# Patient Record
Sex: Male | Born: 1965 | Race: Black or African American | Hispanic: No | Marital: Married | Smoking: Former smoker
Health system: Southern US, Community
[De-identification: ages and names within clinical notes are randomized; demographics above are authoritative.]

## PROBLEM LIST (undated history)

## (undated) DIAGNOSIS — M069 Rheumatoid arthritis, unspecified: Secondary | ICD-10-CM

## (undated) DIAGNOSIS — I10 Essential (primary) hypertension: Secondary | ICD-10-CM

## (undated) DIAGNOSIS — M1990 Unspecified osteoarthritis, unspecified site: Secondary | ICD-10-CM

## (undated) DIAGNOSIS — Z8669 Personal history of other diseases of the nervous system and sense organs: Secondary | ICD-10-CM

## (undated) DIAGNOSIS — C61 Malignant neoplasm of prostate: Secondary | ICD-10-CM

## (undated) DIAGNOSIS — J45909 Unspecified asthma, uncomplicated: Secondary | ICD-10-CM

## (undated) HISTORY — DX: Essential (primary) hypertension: I10

## (undated) HISTORY — DX: Personal history of other diseases of the nervous system and sense organs: Z86.69

## (undated) HISTORY — DX: Rheumatoid arthritis, unspecified: M06.9

## (undated) HISTORY — PX: PROSTATE SURGERY: SHX751

## (undated) HISTORY — DX: Unspecified osteoarthritis, unspecified site: M19.90

## (undated) HISTORY — DX: Malignant neoplasm of prostate: C61

---

## 2009-04-23 ENCOUNTER — Ambulatory Visit (HOSPITAL_BASED_OUTPATIENT_CLINIC_OR_DEPARTMENT_OTHER): Admission: RE | Admit: 2009-04-23 | Discharge: 2009-04-23 | Payer: Self-pay | Admitting: Otolaryngology

## 2009-04-27 ENCOUNTER — Ambulatory Visit: Payer: Self-pay | Admitting: Internal Medicine

## 2011-03-05 DIAGNOSIS — C61 Malignant neoplasm of prostate: Secondary | ICD-10-CM | POA: Insufficient documentation

## 2011-03-05 HISTORY — DX: Malignant neoplasm of prostate: C61

## 2011-03-25 ENCOUNTER — Other Ambulatory Visit: Payer: Self-pay | Admitting: Urology

## 2011-03-26 ENCOUNTER — Other Ambulatory Visit: Payer: Self-pay | Admitting: Urology

## 2011-04-14 ENCOUNTER — Encounter: Payer: Self-pay | Admitting: *Deleted

## 2011-04-14 DIAGNOSIS — M1990 Unspecified osteoarthritis, unspecified site: Secondary | ICD-10-CM | POA: Insufficient documentation

## 2011-04-14 DIAGNOSIS — I10 Essential (primary) hypertension: Secondary | ICD-10-CM | POA: Insufficient documentation

## 2011-04-14 DIAGNOSIS — Z8669 Personal history of other diseases of the nervous system and sense organs: Secondary | ICD-10-CM | POA: Insufficient documentation

## 2011-04-14 NOTE — Progress Notes (Signed)
Path:03/05/11=Prostate Bxs=Adenocarcinoma,Gleason=3+3=6, Volume=20cc, PSA=3.23 PSA 07/2010=3.44 PSA 10/2010=3.23 PSA 02/27/08=2.48  Married, 2 children, Therapist, sports ,  Mother deceased pneumonia age 47,paternal hx Prostate Ca     ALlergies:NKDA   05/13/11 Scheduled for Radical Robot Assisted  Laparoscopic prostatectomy with Dr. Natalia Leatherwood

## 2011-04-15 ENCOUNTER — Encounter: Payer: Self-pay | Admitting: Radiation Oncology

## 2011-04-15 ENCOUNTER — Ambulatory Visit
Admission: RE | Admit: 2011-04-15 | Discharge: 2011-04-15 | Disposition: A | Discharge status: Home / Self Care | Payer: BC Managed Care – PPO | Source: Ambulatory Visit | Attending: Radiation Oncology | Admitting: Radiation Oncology

## 2011-04-15 ENCOUNTER — Ambulatory Visit: Payer: Self-pay

## 2011-04-15 VITALS — BP 130/78 | HR 63 | Temp 98.4°F | Resp 20 | Ht 72.0 in | Wt 213.8 lb

## 2011-04-15 DIAGNOSIS — C61 Malignant neoplasm of prostate: Secondary | ICD-10-CM | POA: Insufficient documentation

## 2011-04-15 NOTE — Progress Notes (Signed)
Please see the Nurse Progress Note in the MD Initial Consult Encounter for this patient. 

## 2011-04-15 NOTE — Progress Notes (Signed)
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 male who hasn't diligently pursued PSA screening because of the increased risk for prostate cancer based on family history and raise. His PSA level on 02/27/2008 was 2.48. This showed a gradual increase to a level of 3.44 in April of 2012. He short interval follow up PSA on 10/21/2002 was 3.23. The patient pursued evaluation with urology and was found to have no nodules with a 40 g prostate on digital rectal exam. He has a rising trend of his PSA and family history, Dr. Tawanna Ward elected to proceed with transrectal ultrasound and prostate biopsies on November 29 of 2012. Prostate volume was measured to be 20 cc. 12 core biopsy samples were obtained and 3/12 were positive for adenocarcinoma with a Gleason score 3+3 and less than 5% left lateral base, 40% left lateral apex, and 40% left apex. The patient review the biopsy results with Dr. Margarita Ward and has currently been referred to discuss potential treatment options  PREVIOUS RADIATION THERAPY: No  PAST MEDICAL HISTORY:  has a past medical history of History of sleep apnea; Hypertension; Arthritis; and Prostate cancer (03/05/11).    PAST SURGICAL HISTORY:History reviewed. No pertinent past surgical history.  FAMILY HISTORY: family history includes Cancer (age of onset:68) in his father and Pneumonia in his mother.  SOCIAL HISTORY:  reports that he quit smoking about 20 years ago. He does not have any smokeless tobacco history on file. He reports that he drinks alcohol. He reports that he does not use illicit drugs.  ALLERGIES:  Review of patient's allergies indicates no known allergies.  MEDICATIONS:  Current Outpatient Prescriptions  Medication Sig Dispense Refill  . Multiple Vitamin (MULTIVITAMIN) tablet Take 1 tablet by mouth daily.        . propranolol (INDERAL) 20 MG tablet Take 20 mg by mouth 2 (two) times daily.          REVIEW OF SYSTEMS:  A comprehensive review of systems was negative. a 15 point review of systems is documented in the medical record and I reviewed this with the patient today. Patient did a lot of S. questionnaire reporting score 0 suggesting mild to minimal outflow obstructive symptoms. He also fell out of a rectal function questionnaire indicating that he is able achieve erection to initiate intercourse but sometimes struggles to maintain erection through completion of intercourse.   PHYSICAL EXAM:  height is 6' (1.829 m) and weight is 213 lb 12.8 oz (96.979 kg). His oral temperature is 98.4 F (36.9 C). His blood pressure is 130/78 and his pulse is 63. His respiration is 20.  further detailed physical exam was deferred today. The patient was documented to have no nodules in the prostate per urology.    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 He falls into a favorable risk group for management of prostate cancer and is eligible for a variety of potential treatment options. His options include active surveillance, radical prostatectomy, external beam radiotherapy, and prostate radiotherapy.  PLAN: Today, I spent time talking with Victor Ward about his  findings and workup thus far. We talked about the natural history of prostate cancer and potential treatment options for prostate cancer. We discussed the implications of T. stage, Gleason score and PSA on treatment decisions will his treatment outcomes. We compared and contrasted radical prostatectomy against radiation therapy and discussed the different forms  of radiation therapy including brachytherapy and external radiation. We talked about the potential advantages of surgery for patients who are under the age of 16. We discussed the long-term followup with surgical series. We talked about the potential for full dose prostatic fossa radiotherapy for salvage in the event of biochemical failure following surgery. We talked about the potential advantage of surgical staging to assess the extent of tumor. We also talked about radiotherapy.  We spent more than 50% or 1 hour visit today patient counseling and coordination of care. Ultimately we spent time talking about watchful waiting and active surveillance. Patient understands that this is a option for him. Over time, in all likelihood a rising PSA or increasing involvement of the prostate gland by cancer would likely lead towards intervention given his young age. We talked about using watchful waiting for delayed intervention versus immediate treatment. At this point, I think the patient is leaning towards radical prostatectomy which is very appropriate, but he seems to have an interest in the lying treatment for some period of active surveillance.  I enjoyed meeting with the patient today and will look forward to follow along his progress. I provided with my business card and direct office phone number if he has additional questions or concerns as he progresses through decision-making and treatment.   ------------------------------------------------  Artist Pais. Kathrynn Running, M.D.

## 2011-04-17 ENCOUNTER — Encounter: Payer: Self-pay | Admitting: *Deleted

## 2011-05-13 ENCOUNTER — Inpatient Hospital Stay (HOSPITAL_COMMUNITY): Admission: RE | Admit: 2011-05-13 | Payer: BC Managed Care – PPO | Source: Ambulatory Visit | Admitting: Urology

## 2011-05-13 ENCOUNTER — Encounter (HOSPITAL_COMMUNITY): Admission: RE | Payer: Self-pay | Source: Ambulatory Visit

## 2011-05-13 SURGERY — ROBOTIC ASSISTED LAPAROSCOPIC RADICAL PROSTATECTOMY
Anesthesia: General

## 2011-05-29 ENCOUNTER — Encounter: Payer: Self-pay | Admitting: *Deleted

## 2011-05-29 NOTE — Progress Notes (Signed)
[facility] Psychosocial Distress Screening Clinical Social Work  Clinical Social Work was referred by distress screening protocol.  The patient scored a 8 on the Psychosocial Distress Thermometer which indicates moderate distress. Doctoral counseling intern Elige Radon McKibben attempted to contact patient to assess for distress and other psychosocial needs. McKibben made multiple attempts to contact patient and left message on home phone number.    Clinical Social Worker follow up needed: no  If yes, follow up plan:  Kathrin Penner, MSW, [facility] Clinical Social Worker [facility] [phone]

## 2011-08-25 NOTE — Progress Notes (Signed)
Encounter addended by: Lowella Petties, RN on: 08/25/2011 11:00 AM<BR>     Documentation filed: Charges VN

## 2013-04-17 ENCOUNTER — Emergency Department (HOSPITAL_COMMUNITY)
Admission: EM | Admit: 2013-04-17 | Discharge: 2013-04-17 | Disposition: A | Discharge status: Home / Self Care | Payer: Managed Care, Other (non HMO) | Attending: Emergency Medicine | Admitting: Emergency Medicine

## 2013-04-17 ENCOUNTER — Emergency Department (HOSPITAL_COMMUNITY): Payer: Managed Care, Other (non HMO)

## 2013-04-17 ENCOUNTER — Encounter (HOSPITAL_COMMUNITY): Payer: Self-pay | Admitting: Emergency Medicine

## 2013-04-17 DIAGNOSIS — J111 Influenza due to unidentified influenza virus with other respiratory manifestations: Secondary | ICD-10-CM | POA: Insufficient documentation

## 2013-04-17 DIAGNOSIS — IMO0001 Reserved for inherently not codable concepts without codable children: Secondary | ICD-10-CM | POA: Insufficient documentation

## 2013-04-17 DIAGNOSIS — M19029 Primary osteoarthritis, unspecified elbow: Secondary | ICD-10-CM | POA: Insufficient documentation

## 2013-04-17 DIAGNOSIS — J45901 Unspecified asthma with (acute) exacerbation: Secondary | ICD-10-CM | POA: Insufficient documentation

## 2013-04-17 DIAGNOSIS — I10 Essential (primary) hypertension: Secondary | ICD-10-CM | POA: Insufficient documentation

## 2013-04-17 DIAGNOSIS — R5381 Other malaise: Secondary | ICD-10-CM | POA: Insufficient documentation

## 2013-04-17 DIAGNOSIS — Z79899 Other long term (current) drug therapy: Secondary | ICD-10-CM | POA: Insufficient documentation

## 2013-04-17 DIAGNOSIS — Z8546 Personal history of malignant neoplasm of prostate: Secondary | ICD-10-CM | POA: Insufficient documentation

## 2013-04-17 DIAGNOSIS — Z8669 Personal history of other diseases of the nervous system and sense organs: Secondary | ICD-10-CM | POA: Insufficient documentation

## 2013-04-17 DIAGNOSIS — R5383 Other fatigue: Secondary | ICD-10-CM

## 2013-04-17 DIAGNOSIS — Z87891 Personal history of nicotine dependence: Secondary | ICD-10-CM | POA: Insufficient documentation

## 2013-04-17 DIAGNOSIS — R51 Headache: Secondary | ICD-10-CM | POA: Insufficient documentation

## 2013-04-17 HISTORY — DX: Unspecified asthma, uncomplicated: J45.909

## 2013-04-17 LAB — CBC
HEMATOCRIT: 39.9 % (ref 39.0–52.0)
Hemoglobin: 13.9 g/dL (ref 13.0–17.0)
MCH: 30.3 pg (ref 26.0–34.0)
MCHC: 34.8 g/dL (ref 30.0–36.0)
MCV: 86.9 fL (ref 78.0–100.0)
PLATELETS: 152 10*3/uL (ref 150–400)
RBC: 4.59 MIL/uL (ref 4.22–5.81)
RDW: 13.2 % (ref 11.5–15.5)
WBC: 16 10*3/uL — AB (ref 4.0–10.5)

## 2013-04-17 LAB — POCT I-STAT, CHEM 8
BUN: 13 mg/dL (ref 6–23)
CALCIUM ION: 1.21 mmol/L (ref 1.12–1.23)
Chloride: 101 mEq/L (ref 96–112)
Creatinine, Ser: 1.3 mg/dL (ref 0.50–1.35)
GLUCOSE: 173 mg/dL — AB (ref 70–99)
HCT: 45 % (ref 39.0–52.0)
Hemoglobin: 15.3 g/dL (ref 13.0–17.0)
Potassium: 4 mEq/L (ref 3.7–5.3)
Sodium: 140 mEq/L (ref 137–147)
TCO2: 26 mmol/L (ref 0–100)

## 2013-04-17 MED ORDER — ALBUTEROL SULFATE HFA 108 (90 BASE) MCG/ACT IN AERS
2.0000 | INHALATION_SPRAY | RESPIRATORY_TRACT | Status: DC | PRN
  Administered 2013-04-17: 2 via RESPIRATORY_TRACT
  Filled 2013-04-17: qty 6.7

## 2013-04-17 MED ORDER — SODIUM CHLORIDE 0.9 % IV BOLUS (SEPSIS)
1000.0000 mL | Freq: Once | INTRAVENOUS | Status: AC
  Administered 2013-04-17: 1000 mL via INTRAVENOUS

## 2013-04-17 MED ORDER — ACETAMINOPHEN 325 MG PO TABS: 650.0000 mg | ORAL_TABLET | Freq: Once | ORAL | Status: DC

## 2013-04-17 MED ORDER — ACETAMINOPHEN 325 MG PO TABS
650.0000 mg | ORAL_TABLET | Freq: Once | ORAL | Status: AC
  Administered 2013-04-17: 650 mg via ORAL
  Filled 2013-04-17: qty 2

## 2013-04-17 NOTE — Discharge Instructions (Signed)
The chest x-ray does not reveal any pneumonia.  At this time.  You have been given an inhaler to use as follows 2 puffs every 4-6 hours while awake for the next 2 days, then as needed. Please call your primary care physician to let them know of your ED visit.  Tonight.  I would advise not going to the office unless your specifically instructed to buy them. You have been given a work note, is recommended that he stay home on until you are  fever free for 24 hours

## 2013-04-17 NOTE — ED Provider Notes (Signed)
Medical screening examination/treatment/procedure(s) were performed by non-physician practitioner and as supervising physician I was immediately available for consultation/collaboration.    Kalman Drape, MD 04/17/13 [phone]

## 2013-04-17 NOTE — ED Notes (Signed)
Bed: XH37 Expected date: 04/17/13 Expected time: 4:13 AM Means of arrival: Ambulance Comments: Bed 5, EMS, 39 M, [place] Symptoms/Asthma

## 2013-04-17 NOTE — ED Notes (Signed)
Per EMS report: pt from home: pt c/o SOB, fever, fatigue, cough, HA.  Pt denies feeling a fever but EMS reports pt is hot to the touch.  This morning is day 3 of his symptoms. EMS reports lungs were clear.  EMS VS: BP: 154/90, HR 112, RR: 20, 98% 2LNC.

## 2013-04-17 NOTE — ED Provider Notes (Addendum)
CSN: [id number]     Arrival date & time 04/17/13  3762 History   First MD Initiated Contact with Patient 04/17/13 0435     Chief Complaint  Patient presents with  . Influenza   (Consider location/radiation/quality/duration/timing/severity/associated sxs/prior Treatment) HPI Comments: 3 days of fever myalgia, congestion cough developed SOB last night with a "strangled" feeling in his throat  Ems was called at about 11:30 but by the time.  They arrived, patient was feeling, better he again developed the same sensation of choking, and feeling short of breath, at 3:30 in the morning EMS and transported patient to the hospital, where he was febrile, tachycardic, but no longer having shortness of breath.  He does have an appointment with his primary care physician.  This morning.  He thought he could wait, but became frightened by the second episode of shortness of breath.  Patient states his appetite has been fine.  Has been drinking plenty of fluids.  No vomiting, or diarrhea  Patient is a 48 y.o. male presenting with flu symptoms. The history is provided by the patient.  Influenza Presenting symptoms: cough, fatigue, fever, headache, rhinorrhea and shortness of breath   Presenting symptoms: no diarrhea, no nausea, no sore throat and no vomiting   Cough:    Cough characteristics:  Non-productive   Severity:  Moderate   Onset quality:  Sudden   Duration:  3 days   Progression:  Worsening   Chronicity:  New Fatigue:    Severity:  Moderate   Duration:  3 days   Timing:  Constant Fever:    Duration:  3 days   Timing:  Intermittent   Temp source:  Subjective Headaches:    Severity:  Moderate   Onset quality:  Gradual   Timing:  Intermittent   Chronicity:  New Severity:  Mild Onset quality:  Sudden Duration:  1 day Progression:  Waxing and waning Chronicity:  New Ineffective treatments:  None tried Associated symptoms: decreased physical activity and nasal congestion   Risk factors: no  immunocompromised state and no sick contacts   Risk factors comment:  Hypertension   Past Medical History  Diagnosis Date  . History of sleep apnea   . Hypertension   . Arthritis     R elbow, dx w/RA age 64, 2 flareups  . Prostate cancer 03/05/11    gleason 3+3=6, volume 20 cc  . Asthma    Past Surgical History  Procedure Laterality Date  . Prostate surgery      Prostate removal   Family History  Problem Relation Age of Onset  . Pneumonia Mother   . Cancer Father 46    prostate   History  Substance Use Topics  . Smoking status: Former Smoker -- 11 years    Quit date: 04/07/1991  . Smokeless tobacco: Not on file     Comment: 1 pk per week  . Alcohol Use: No     Comment: few per month    Review of Systems  Constitutional: Positive for fever and fatigue.  HENT: Positive for congestion and rhinorrhea. Negative for sore throat.   Respiratory: Positive for cough and shortness of breath.   Gastrointestinal: Negative for nausea, vomiting and diarrhea.  Neurological: Positive for headaches.    Allergies  Review of patient's allergies indicates no known allergies.  Home Medications   Current Outpatient Rx  Name  Route  Sig  Dispense  Refill  . acetaminophen (TYLENOL) 500 MG tablet   Oral   Take  1,000 mg by mouth every 6 (six) hours as needed for moderate pain or headache.         Marland Kitchen amLODipine (NORVASC) 2.5 MG tablet   Oral   Take 2.5 mg by mouth every morning.         . diphenhydrAMINE (BENADRYL) 25 MG tablet   Oral   Take 25 mg by mouth every 6 (six) hours as needed for allergies.         Marland Kitchen guaiFENesin (MUCINEX) 600 MG 12 hr tablet   Oral   Take 600 mg by mouth 2 (two) times daily as needed for cough or to loosen phlegm.         . Multiple Vitamin (MULTIVITAMIN WITH MINERALS) TABS tablet   Oral   Take 1 tablet by mouth every morning.         Marland Kitchen PRESCRIPTION MEDICATION   Injection   Inject 2 mLs as directed 3 (three) times a week.  Papaverine-phentolamine 30mg -1mg /ml custom mixed medication injected three times weekly as needed for erectile dysfunction          BP 141/81  Pulse 97  Temp(Src) 99.2 F (37.3 C) (Oral)  Resp 20  SpO2 98% Physical Exam  Nursing note and vitals reviewed. Constitutional: He is oriented to person, place, and time. He appears well-developed and well-nourished.  HENT:  Head: Normocephalic.  Neck: Normal range of motion.  Cardiovascular: Regular rhythm.  Tachycardia present.   Pulmonary/Chest: Effort normal and breath sounds normal.  Abdominal: Soft.  Lymphadenopathy:    He has no cervical adenopathy.  Neurological: He is alert and oriented to person, place, and time.  Skin: Skin is warm. No rash noted.    ED Course  Procedures (including critical care time) Labs Review Labs Reviewed  CBC - Abnormal; Notable for the following:    WBC 16.0 (*)    All other components within normal limits  POCT I-STAT, CHEM 8 - Abnormal; Notable for the following:    Glucose, Bld 173 (*)    All other components within normal limits   Imaging Review Dg Chest 2 View  04/17/2013   CLINICAL DATA:  Shortness of breath and PE  EXAM: CHEST  2 VIEW  COMPARISON:  None.  FINDINGS: Normal heart size and mediastinal contours. No acute infiltrate or edema. No effusion or pneumothorax. No acute osseous findings.  IMPRESSION: No active cardiopulmonary disease.   Electronically Signed   By: Jorje Guild M.D.   On: 04/17/2013 05:33    EKG Interpretation   None       MDM   1. Influenza     Is to be given, 1 L fluid, 650 of Tylenol.  Patient is feeling much better.  His vital signs have stabilized.  He is been given an albuterol inhaler to use at home 2 puffs every 4-6 hours while awake for 2 days and then as needed.  He is to call his primary care physician, but not actually go to the office unless he is specifically instructed to by his PCP    Garald Balding, NP 04/17/13 [place],  NP 04/28/13 2046

## 2013-04-24 NOTE — ED Provider Notes (Deleted)
CSN: [id number]     Arrival date & time 04/17/13  4166 History   First MD Initiated Contact with Patient 04/17/13 0435     Chief Complaint  Patient presents with  . Influenza   (Consider location/radiation/quality/duration/timing/severity/associated sxs/prior Treatment) HPI  Past Medical History  Diagnosis Date  . History of sleep apnea   . Hypertension   . 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 Prostate cancer 03/05/11    gleason 3+3=6, volume 20 cc  . Asthma    Past Surgical History  Procedure Laterality Date  . Prostate surgery      Prostate removal   Family History  Problem Relation Age of Onset  . Pneumonia Mother   . Cancer Father 22    prostate   History  Substance Use Topics  . Smoking status: Former Smoker -- 11 years    Quit date: 04/07/1991  . Smokeless tobacco: Not on file     Comment: 1 pk per week  . Alcohol Use: No     Comment: few per month    Review of Systems  Allergies  Review of patient's allergies indicates no known allergies.  Home Medications   Current Outpatient Rx  Name  Route  Sig  Dispense  Refill  . acetaminophen (TYLENOL) 500 MG tablet   Oral   Take 1,000 mg by mouth every 6 (six) hours as needed for moderate pain or headache.         Marland Kitchen amLODipine (NORVASC) 2.5 MG tablet   Oral   Take 2.5 mg by mouth every morning.         . diphenhydrAMINE (BENADRYL) 25 MG tablet   Oral   Take 25 mg by mouth every 6 (six) hours as needed for allergies.         Marland Kitchen guaiFENesin (MUCINEX) 600 MG 12 hr tablet   Oral   Take 600 mg by mouth 2 (two) times daily as needed for cough or to loosen phlegm.         . Multiple Vitamin (MULTIVITAMIN WITH MINERALS) TABS tablet   Oral   Take 1 tablet by mouth every morning.         Marland Kitchen PRESCRIPTION MEDICATION   Injection   Inject 2 mLs as directed 3 (three) times a week. Papaverine-phentolamine 30mg -1mg /ml custom mixed medication injected three times weekly as needed for erectile  dysfunction          BP 133/68  Pulse 93  Temp(Src) 98.6 F (37 C) (Oral)  Resp 18  SpO2 94% Physical Exam  ED Course  Procedures (including critical care time) Labs Review Labs Reviewed  CBC - Abnormal; Notable for the following:    WBC 16.0 (*)    All other components within normal limits  POCT I-STAT, CHEM 8 - Abnormal; Notable for the following:    Glucose, Bld 173 (*)    All other components within normal limits   Imaging Review No results found.  EKG Interpretation   None       MDM   1. Influenza        Garald Balding, NP 04/24/13 2239

## 2013-04-29 NOTE — ED Provider Notes (Signed)
Medical screening examination/treatment/procedure(s) were performed by non-physician practitioner and as supervising physician I was immediately available for consultation/collaboration.    Rasean Joos M Fard Borunda, MD 04/29/13 0735 

## 2014-01-04 ENCOUNTER — Other Ambulatory Visit: Payer: Self-pay | Admitting: Internal Medicine

## 2014-01-04 ENCOUNTER — Ambulatory Visit
Admission: RE | Admit: 2014-01-04 | Discharge: 2014-01-04 | Disposition: A | Discharge status: Home / Self Care | Payer: Managed Care, Other (non HMO) | Source: Ambulatory Visit | Attending: Internal Medicine | Admitting: Internal Medicine

## 2014-01-04 DIAGNOSIS — M545 Low back pain: Secondary | ICD-10-CM

## 2015-12-09 IMAGING — CR DG LUMBAR SPINE COMPLETE 4+V
5 series · 5 of 5 positions shown · non-contrast
Comparison: None.

CLINICAL DATA: Strain back lifting weights months ago. Now with
chronic low back pain especially on the left.

EXAM:
LUMBAR SPINE - COMPLETE 4+ VIEW

[view not recorded (1 of 5)]
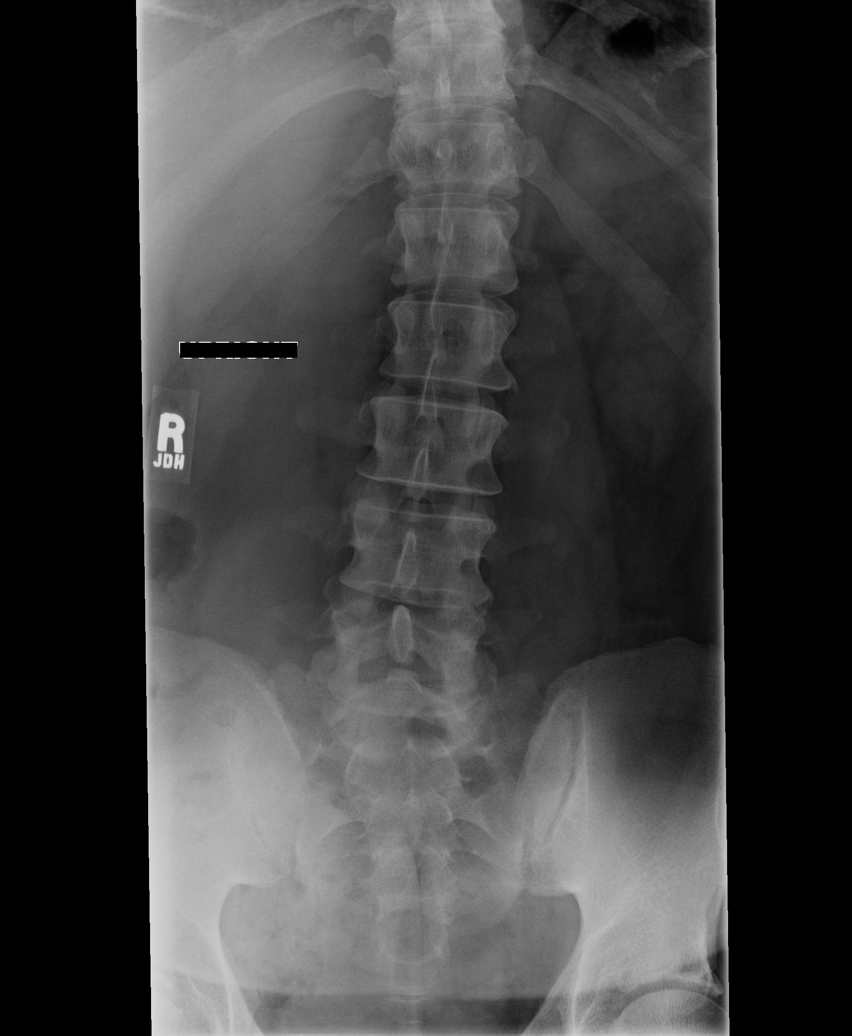

[view not recorded (2 of 5)]
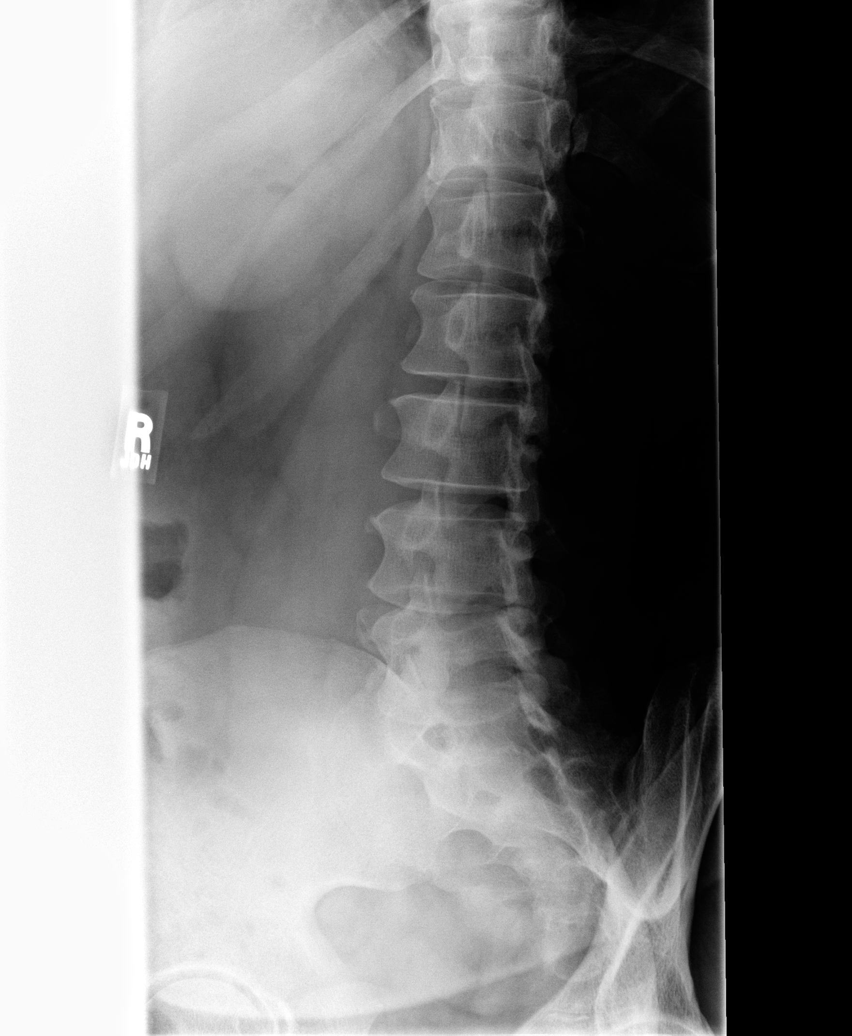

[view not recorded (3 of 5)]
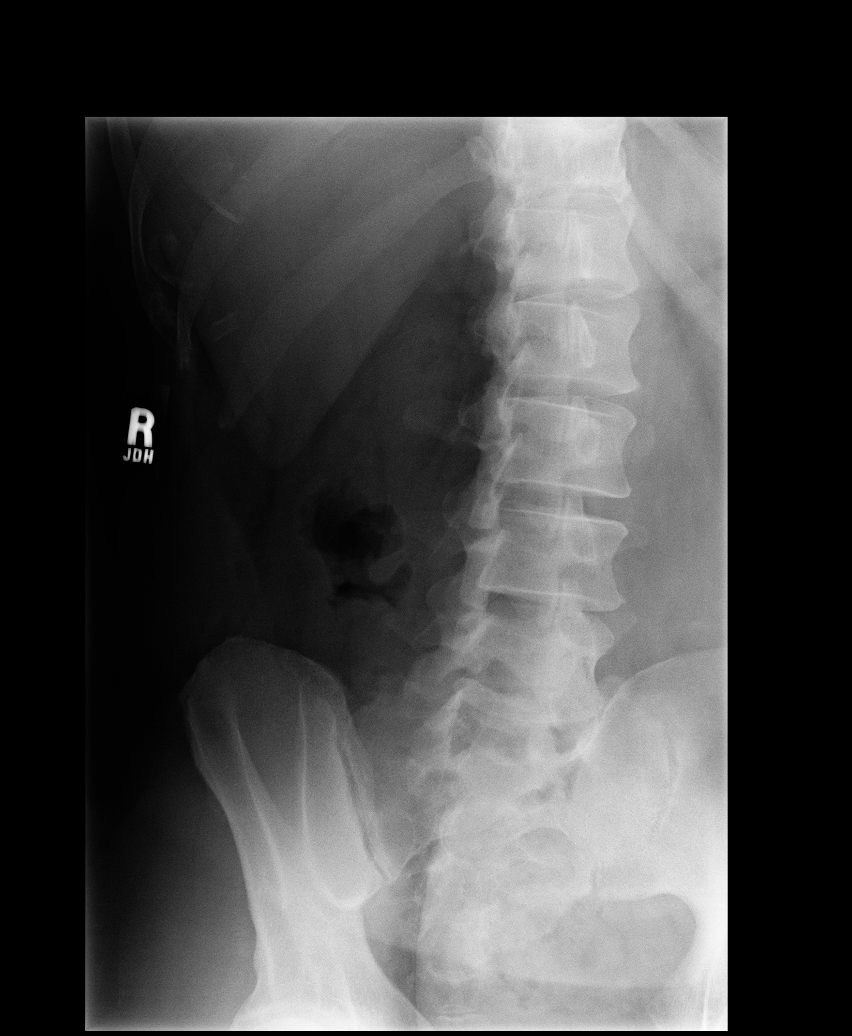

[view not recorded (4 of 5)]
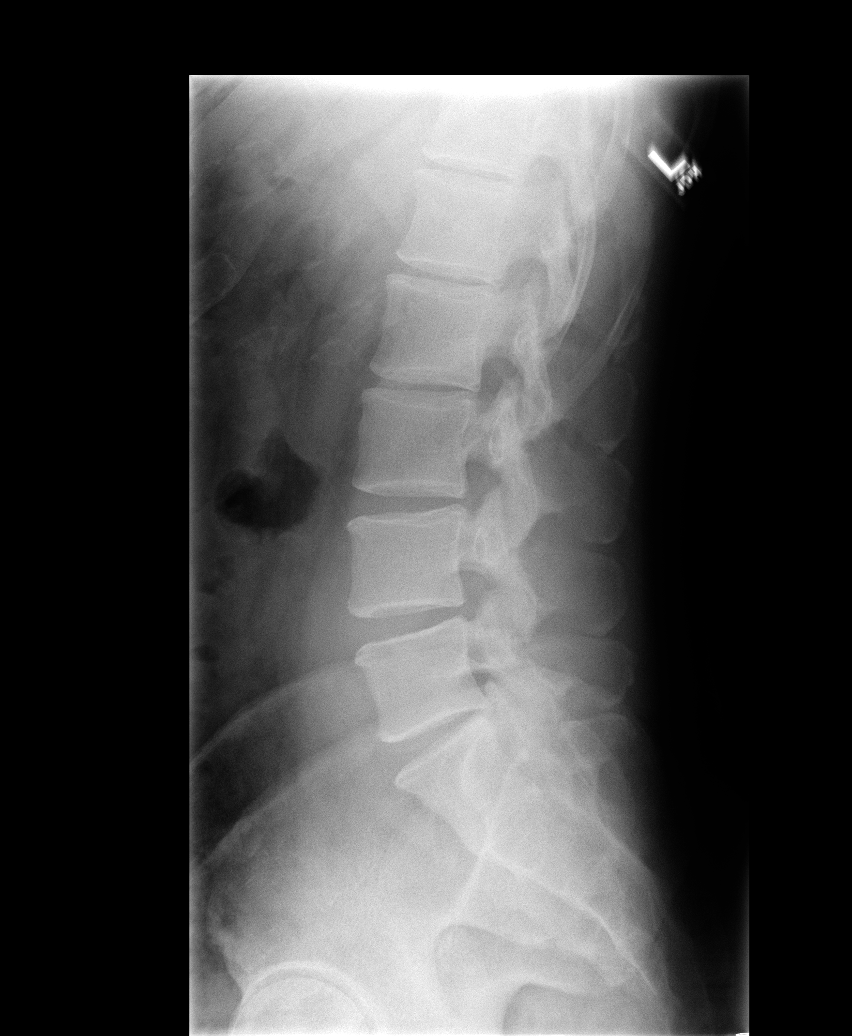

[view not recorded (5 of 5)]
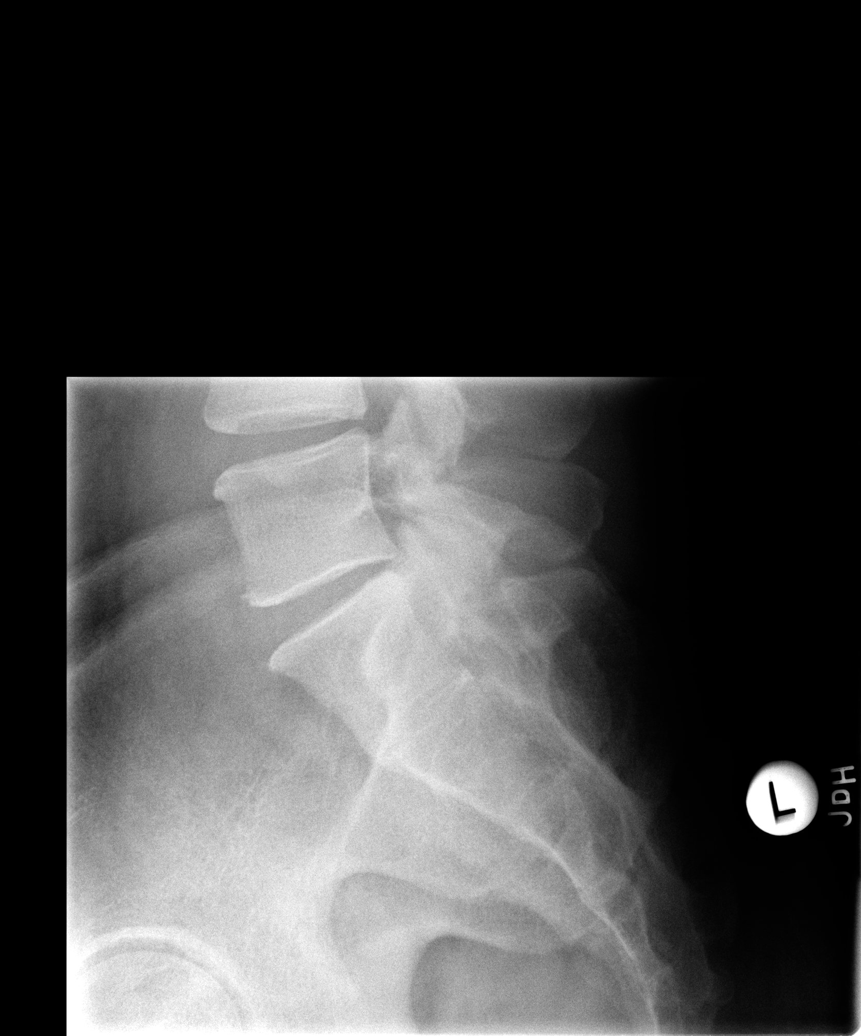

[5 of 5 positions shown; findings below may reference images not displayed]

FINDINGS: There is a mild curvature of the lower thoracic and upper lumbar
spine which is convex towards the left. The vertebral body heights
are well preserved. There is no evidence for fracture. Normal
appearance of the facet joints. Minimal all multi level ventral
endplate spurring identified.
IMPRESSION: 1. No acute findings.
2. Mild leftward curvature of the lower thoracic and lumbar spine.

## 2016-04-07 ENCOUNTER — Other Ambulatory Visit (HOSPITAL_COMMUNITY): Payer: Self-pay | Admitting: Internal Medicine

## 2016-04-07 DIAGNOSIS — R002 Palpitations: Secondary | ICD-10-CM

## 2016-04-08 ENCOUNTER — Ambulatory Visit (HOSPITAL_COMMUNITY): Payer: Managed Care, Other (non HMO)

## 2019-11-23 NOTE — Progress Notes (Signed)
Cardiology Office Note   Date:  11/27/2019   ID:  Victor Ward, DOB 1966/01/04, MRN [id number]  PCP:  Victor Dy, MD  Cardiologist:   Victor Fiorentino Martinique, MD   Chief Complaint  Patient presents with  . Palpitations      History of Present Illness: Victor Ward is a 54 y.o. male who is seen at the request of Dr Victor Ward for evaluation of PVCs.  He has a history of HTN and sleep apnea. Was on CPAP in the past but quit using it and hasn't felt that he needed it. He was noted on routine physical to have PVCs. This has been present since 2017 but more frequent on Ecg. He is completely unaware of them. Denies dizziness, syncope, palpitations, chest pain or dyspnea. Works out strenuously 3 days a week.     Past Medical History:  Diagnosis Date  . Arthritis    R elbow, dx w/RA age 59, 2 flareups  . Asthma   . History of sleep apnea   . Hypertension   . Prostate cancer ([place]) 03/05/11   gleason 3+3=6, volume 20 cc    Past Surgical History:  Procedure Laterality Date  . PROSTATE SURGERY     Prostate removal     Current Outpatient Medications  Medication Sig Dispense Refill  . acetaminophen (TYLENOL) 500 MG tablet Take 1,000 mg by mouth every 6 (six) hours as needed for moderate pain or headache.    Marland Kitchen amLODipine (NORVASC) 2.5 MG tablet Take 2.5 mg by mouth every morning.    Marland Kitchen guaiFENesin (MUCINEX) 600 MG 12 hr tablet Take 600 mg by mouth 2 (two) times daily as needed for cough or to loosen phlegm.    . Multiple Vitamin (MULTIVITAMIN WITH MINERALS) TABS tablet Take 1 tablet by mouth every morning.    Marland Kitchen PRESCRIPTION MEDICATION Inject 2 mLs as directed 3 (three) times a week. Papaverine-phentolamine 30mg -1mg /ml custom mixed medication injected three times weekly as needed for erectile dysfunction     No current facility-administered medications for this visit.    Allergies:   Patient has no known allergies.    Social History:  The patient  reports that he quit smoking about 28 years  ago. He quit after 11.00 years of use. He has never used smokeless tobacco. He reports that he does not drink alcohol and does not use drugs.   Family History:  The patient's family history includes Cancer (age of onset: 73) in his father; Pneumonia in his mother.    ROS:  Please see the history of present illness.   Otherwise, review of systems are positive for none.   All other systems are reviewed and negative.    PHYSICAL EXAM: VS:  BP 126/84   Pulse 79   Ht 6' (1.829 m)   Wt 232 lb 3.2 oz (105.3 kg)   SpO2 97%   BMI 31.49 kg/m  , BMI Body mass index is 31.49 kg/m. GEN: Well nourished, overweight, in no acute distress  HEENT: normal  Neck: no JVD, carotid bruits, or masses Cardiac: RRR with frequent extrasystoles; no murmurs, rubs, or gallops,no edema  Respiratory:  clear to auscultation bilaterally, normal work of breathing GI: soft, nontender, nondistended, + BS MS: no deformity or atrophy  Skin: warm and dry, no rash Neuro:  Strength and sensation are intact Psych: euthymic mood, full affect   EKG:  EKG is ordered today. The ekg ordered today demonstrates NSR rate 79 with frequent unifocal PVCs. RAD. I have  personally reviewed and interpreted this study.    Recent Labs: No results found for requested labs within last 8760 hours.    Lipid Panel No results found for: CHOL, TRIG, HDL, CHOLHDL, VLDL, LDLCALC, LDLDIRECT    Wt Readings from Last 3 Encounters:  11/27/19 232 lb 3.2 oz (105.3 kg)  04/15/11 213 lb 12.8 oz (97 kg)      Other studies Reviewed: Additional studies/ records that were reviewed today include: none Review of the above records demonstrates:none   ASSESSMENT AND PLAN:  1.  PVCs. Noted chronically. ? More frequent. Patient is completely asymptomatic. Will request copy of most recent lab work. Will schedule for an Echocardiogram. If LV function is normal this would give support that these PVCs are benign.  2. OSA 3. HTN controlled.     Current medicines are reviewed at length with the patient today.  The patient does not have concerns regarding medicines.  The following changes have been made:  no change  Labs/ tests ordered today include:   Orders Placed This Encounter  Procedures  . EKG 12-Lead  . ECHOCARDIOGRAM COMPLETE     Disposition:   FU TBD depending on Echo.  Signed, Victor Degraaf Martinique, MD  11/27/2019 10:44 AM    [facility] [address] [place], [place], [address] Phone [phone], Fax [phone]

## 2019-11-27 ENCOUNTER — Encounter: Payer: Self-pay | Admitting: Cardiology

## 2019-11-27 ENCOUNTER — Other Ambulatory Visit: Payer: Self-pay

## 2019-11-27 ENCOUNTER — Ambulatory Visit: Payer: Commercial Managed Care - PPO | Admitting: Cardiology

## 2019-11-27 VITALS — BP 126/84 | HR 79 | Ht 72.0 in | Wt 232.2 lb

## 2019-11-27 DIAGNOSIS — I493 Ventricular premature depolarization: Secondary | ICD-10-CM

## 2019-11-27 DIAGNOSIS — I10 Essential (primary) hypertension: Secondary | ICD-10-CM

## 2019-11-27 DIAGNOSIS — G4733 Obstructive sleep apnea (adult) (pediatric): Secondary | ICD-10-CM | POA: Diagnosis not present

## 2019-11-27 NOTE — Patient Instructions (Signed)
Medication Instructions:  Continue same medications    Lab Work: None ordered   Testing/Procedures: Schedule Echo   Follow-Up: At [facility], you and your health needs are our priority.  As part of our continuing mission to provide you with exceptional heart care, we have created designated Provider Care Teams.  These Care Teams include your primary Cardiologist (physician) and Advanced Practice Providers (APPs -  Physician Assistants and Nurse Practitioners) who all work together to provide you with the care you need, when you need it.  We recommend signing up for the patient portal called "MyChart".  Sign up information is provided on this After Visit Summary.  MyChart is used to connect with patients for Virtual Visits (Telemedicine).  Patients are able to view lab/test results, encounter notes, upcoming appointments, etc.  Non-urgent messages can be sent to your provider as well.   To learn more about what you can do with MyChart, go to [email or web address].    Your next appointment:  Will be determined after Echo   The format for your next appointment: Office    Provider:  Dr.Jordan

## 2019-12-06 ENCOUNTER — Ambulatory Visit (HOSPITAL_COMMUNITY): Payer: Commercial Managed Care - PPO | Attending: Cardiovascular Disease

## 2019-12-06 ENCOUNTER — Other Ambulatory Visit: Payer: Self-pay

## 2019-12-06 DIAGNOSIS — I493 Ventricular premature depolarization: Secondary | ICD-10-CM | POA: Insufficient documentation

## 2019-12-06 DIAGNOSIS — G4733 Obstructive sleep apnea (adult) (pediatric): Secondary | ICD-10-CM | POA: Diagnosis present

## 2019-12-06 DIAGNOSIS — I10 Essential (primary) hypertension: Secondary | ICD-10-CM | POA: Diagnosis present

## 2019-12-06 LAB — ECHOCARDIOGRAM COMPLETE
Area-P 1/2: 2.85 cm2
S' Lateral: 3.2 cm
